# Patient Record
Sex: Female | Born: 1960 | Race: White | Hispanic: No | Smoking: Never smoker
Health system: Southern US, Community
[De-identification: ages and names within clinical notes are randomized; demographics above are authoritative.]

## PROBLEM LIST (undated history)

## (undated) DIAGNOSIS — M8580 Other specified disorders of bone density and structure, unspecified site: Secondary | ICD-10-CM

## (undated) HISTORY — PX: FRACTURE SURGERY: SHX138

---

## 2004-08-15 ENCOUNTER — Ambulatory Visit: Payer: Self-pay | Admitting: Obstetrics and Gynecology

## 2005-08-30 ENCOUNTER — Ambulatory Visit: Payer: Self-pay | Admitting: Obstetrics and Gynecology

## 2006-05-30 ENCOUNTER — Ambulatory Visit: Payer: Self-pay | Admitting: Internal Medicine

## 2006-11-12 ENCOUNTER — Ambulatory Visit: Payer: Self-pay | Admitting: Obstetrics and Gynecology

## 2007-12-01 ENCOUNTER — Ambulatory Visit: Payer: Self-pay | Admitting: Obstetrics and Gynecology

## 2008-12-02 ENCOUNTER — Ambulatory Visit: Payer: Self-pay | Admitting: Obstetrics and Gynecology

## 2010-02-02 ENCOUNTER — Ambulatory Visit: Payer: Self-pay | Admitting: Unknown Physician Specialty

## 2010-06-12 ENCOUNTER — Emergency Department: Payer: Self-pay | Admitting: Unknown Physician Specialty

## 2010-06-15 ENCOUNTER — Ambulatory Visit: Payer: Self-pay | Admitting: Orthopedic Surgery

## 2013-08-18 ENCOUNTER — Inpatient Hospital Stay: Payer: Self-pay | Admitting: Internal Medicine

## 2013-08-18 LAB — COMPREHENSIVE METABOLIC PANEL
AST: 322 U/L — AB (ref 15–37)
Albumin: 3.2 g/dL — ABNORMAL LOW (ref 3.4–5.0)
Alkaline Phosphatase: 102 U/L
Anion Gap: 9 (ref 7–16)
BUN: 8 mg/dL (ref 7–18)
Bilirubin,Total: 1 mg/dL (ref 0.2–1.0)
CHLORIDE: 95 mmol/L — AB (ref 98–107)
Calcium, Total: 8 mg/dL — ABNORMAL LOW (ref 8.5–10.1)
Co2: 27 mmol/L (ref 21–32)
Creatinine: 0.91 mg/dL (ref 0.60–1.30)
Glucose: 112 mg/dL — ABNORMAL HIGH (ref 65–99)
OSMOLALITY: 262 (ref 275–301)
POTASSIUM: 3.2 mmol/L — AB (ref 3.5–5.1)
SGPT (ALT): 453 U/L — ABNORMAL HIGH
SODIUM: 131 mmol/L — AB (ref 136–145)
Total Protein: 6.4 g/dL (ref 6.4–8.2)

## 2013-08-18 LAB — CBC WITH DIFFERENTIAL/PLATELET
Basophil #: 0 10*3/uL (ref 0.0–0.1)
Basophil %: 0.8 %
EOS PCT: 0 %
Eosinophil #: 0 10*3/uL (ref 0.0–0.7)
HCT: 33.8 % — AB (ref 35.0–47.0)
HGB: 11.9 g/dL — ABNORMAL LOW (ref 12.0–16.0)
LYMPHS ABS: 1.1 10*3/uL (ref 1.0–3.6)
Lymphocyte %: 24.7 %
MCH: 29.6 pg (ref 26.0–34.0)
MCHC: 35.2 g/dL (ref 32.0–36.0)
MCV: 84 fL (ref 80–100)
MONOS PCT: 6.1 %
Monocyte #: 0.3 x10 3/mm (ref 0.2–0.9)
NEUTROS ABS: 3.1 10*3/uL (ref 1.4–6.5)
Neutrophil %: 68.4 %
Platelet: 112 10*3/uL — ABNORMAL LOW (ref 150–440)
RBC: 4.02 10*6/uL (ref 3.80–5.20)
RDW: 13.6 % (ref 11.5–14.5)
WBC: 4.5 10*3/uL (ref 3.6–11.0)

## 2013-08-18 LAB — URINALYSIS, COMPLETE
BACTERIA: NONE SEEN
GLUCOSE, UR: NEGATIVE mg/dL (ref 0–75)
Leukocyte Esterase: NEGATIVE
Nitrite: NEGATIVE
Ph: 5 (ref 4.5–8.0)
Protein: 100
RBC,UR: 1 /HPF (ref 0–5)
Specific Gravity: 1.025 (ref 1.003–1.030)
WBC UR: 2 /HPF (ref 0–5)

## 2013-08-18 LAB — LIPASE, BLOOD: Lipase: 95 U/L (ref 73–393)

## 2013-08-18 LAB — TROPONIN I: Troponin-I: <0.02 ng/mL

## 2013-08-18 LAB — SEDIMENTATION RATE: ERYTHROCYTE SED RATE: 13 mm/h (ref 0–30)

## 2013-08-19 LAB — COMPREHENSIVE METABOLIC PANEL
AST: 167 U/L — AB (ref 15–37)
Albumin: 2.3 g/dL — ABNORMAL LOW (ref 3.4–5.0)
Alkaline Phosphatase: 95 U/L
Anion Gap: 7 (ref 7–16)
BUN: 9 mg/dL (ref 7–18)
Bilirubin,Total: 0.9 mg/dL (ref 0.2–1.0)
CALCIUM: 5.8 mg/dL — AB (ref 8.5–10.1)
CHLORIDE: 107 mmol/L (ref 98–107)
Co2: 23 mmol/L (ref 21–32)
Creatinine: 0.86 mg/dL (ref 0.60–1.30)
GLUCOSE: 91 mg/dL (ref 65–99)
OSMOLALITY: 272 (ref 275–301)
Potassium: 3.3 mmol/L — ABNORMAL LOW (ref 3.5–5.1)
SGPT (ALT): 299 U/L — ABNORMAL HIGH
Sodium: 137 mmol/L (ref 136–145)
Total Protein: 5.1 g/dL — ABNORMAL LOW (ref 6.4–8.2)

## 2013-08-19 LAB — CBC WITH DIFFERENTIAL/PLATELET
Basophil #: 0 10*3/uL (ref 0.0–0.1)
Basophil %: 0.6 %
Eosinophil #: 0 10*3/uL (ref 0.0–0.7)
Eosinophil %: 0.1 %
HCT: 29.8 % — AB (ref 35.0–47.0)
HGB: 9.9 g/dL — ABNORMAL LOW (ref 12.0–16.0)
Lymphocyte #: 2.7 10*3/uL (ref 1.0–3.6)
Lymphocyte %: 43.3 %
MCH: 28.7 pg (ref 26.0–34.0)
MCHC: 33.4 g/dL (ref 32.0–36.0)
MCV: 86 fL (ref 80–100)
MONOS PCT: 3.8 %
Monocyte #: 0.2 x10 3/mm (ref 0.2–0.9)
NEUTROS PCT: 52.2 %
Neutrophil #: 3.3 10*3/uL (ref 1.4–6.5)
PLATELETS: 112 10*3/uL — AB (ref 150–440)
RBC: 3.46 10*6/uL — ABNORMAL LOW (ref 3.80–5.20)
RDW: 14.1 % (ref 11.5–14.5)
WBC: 6.3 10*3/uL (ref 3.6–11.0)

## 2013-08-20 LAB — COMPREHENSIVE METABOLIC PANEL
ALT: 257 U/L — AB
AST: 117 U/L — AB (ref 15–37)
Albumin: 2.4 g/dL — ABNORMAL LOW (ref 3.4–5.0)
Alkaline Phosphatase: 87 U/L
Anion Gap: 12 (ref 7–16)
BILIRUBIN TOTAL: 0.6 mg/dL (ref 0.2–1.0)
BUN: 9 mg/dL (ref 7–18)
CALCIUM: 6.2 mg/dL — AB (ref 8.5–10.1)
CHLORIDE: 112 mmol/L — AB (ref 98–107)
CO2: 17 mmol/L — AB (ref 21–32)
CREATININE: 0.75 mg/dL (ref 0.60–1.30)
EGFR (African American): >60
GLUCOSE: 93 mg/dL (ref 65–99)
Osmolality: 280 (ref 275–301)
POTASSIUM: 3.9 mmol/L (ref 3.5–5.1)
Sodium: 141 mmol/L (ref 136–145)
Total Protein: 5 g/dL — ABNORMAL LOW (ref 6.4–8.2)

## 2013-08-20 LAB — CBC WITH DIFFERENTIAL/PLATELET
BASOS PCT: 1 %
Basophil #: 0.1 10*3/uL (ref 0.0–0.1)
EOS PCT: 0 %
Eosinophil #: 0 10*3/uL (ref 0.0–0.7)
HCT: 31.9 % — ABNORMAL LOW (ref 35.0–47.0)
HGB: 10.4 g/dL — ABNORMAL LOW (ref 12.0–16.0)
Lymphocyte #: 3.3 10*3/uL (ref 1.0–3.6)
Lymphocytes: 39 %
MCH: 28.6 pg (ref 26.0–34.0)
MCHC: 32.7 g/dL (ref 32.0–36.0)
MCV: 87 fL (ref 80–100)
Monocyte #: 0.3 x10 3/mm (ref 0.2–0.9)
Monocyte %: 5 %
Monocytes: 1 %
Neutrophil #: 2.7 10*3/uL (ref 1.4–6.5)
Platelet: 137 10*3/uL — ABNORMAL LOW (ref 150–440)
RBC: 3.65 10*6/uL — ABNORMAL LOW (ref 3.80–5.20)
RDW: 14.2 % (ref 11.5–14.5)
Segmented Neutrophils: 60 %
WBC: 6.4 10*3/uL (ref 3.6–11.0)

## 2013-08-20 LAB — URINE CULTURE

## 2013-08-20 LAB — CLOSTRIDIUM DIFFICILE(ARMC)

## 2013-08-21 LAB — BASIC METABOLIC PANEL
Anion Gap: 13 (ref 7–16)
BUN: 5 mg/dL — ABNORMAL LOW (ref 7–18)
CO2: 20 mmol/L — AB (ref 21–32)
Calcium, Total: 6.8 mg/dL — CL (ref 8.5–10.1)
Chloride: 109 mmol/L — ABNORMAL HIGH (ref 98–107)
Creatinine: 0.53 mg/dL — ABNORMAL LOW (ref 0.60–1.30)
EGFR (Non-African Amer.): >60
GLUCOSE: 83 mg/dL (ref 65–99)
OSMOLALITY: 280 (ref 275–301)
POTASSIUM: 3.3 mmol/L — AB (ref 3.5–5.1)
Sodium: 142 mmol/L (ref 136–145)

## 2013-08-21 LAB — MAGNESIUM: MAGNESIUM: 1.8 mg/dL

## 2013-08-22 LAB — BETA STREP CULTURE(ARMC)

## 2013-08-23 LAB — CULTURE, BLOOD (SINGLE)

## 2013-08-30 LAB — CULTURE, BLOOD (SINGLE)

## 2014-05-08 NOTE — H&P (Signed)
PATIENT NAME:  Paige Reyes, Paige Reyes MR#:  [id number] DATE OF BIRTH:  Aug 22, 1960  DATE OF ADMISSION:  08/18/2013  REFERRING PHYSICIAN:  Dr. Karma Greaser.  PRIMARY CARE PHYSICIAN: [place].   CHIEF COMPLAINT: Fever.   HISTORY OF PRESENT ILLNESS: A 54 year old Caucasian female without significant past medical history presented with fever. Describes 1 week duration of fever which she mentions as being fairly constant, originally occurring on last Tuesday exactly 7 days ago. She saw her PCP 4 days ago and was started on Bactrim for suspected urinary tract infection; however, she was asymptomatic at that time. Despite the addition of antibiotic therapy, had no change in symptomatology. She now complains of having 1 day duration of nausea and nonbloody emesis with a few bouts of emesis. Denies any further symptomatology otherwise other than general fatigue.   REVIEW OF SYSTEMS:  CONSTITUTIONAL: Positive for fevers, chills, fatigue, weakness.  EYES: Denies blurred vision, double vision or eye pain. EARS, NOSE AND THROAT: Denies tinnitus, ear pain or hearing loss.  RESPIRATORY: Denies cough, wheeze, shortness of breath.  CARDIOVASCULAR: Denies chest, palpitations or edema.  GASTROINTESTINAL: Positive for nausea, vomiting as described above.  Denies diarrhea or abdominal pain.  GENITOURINARY: Denies dysuria or hematuria. ENDOCRINE: Denies nocturia or thyroid problems.  HEMATOLOGY AND LYMPHOLOGY:  Denies easy bruising, bleeding. SKIN: Denies rash or lesions.  MUSCULOSKELETAL: Denies pain in neck, back, shoulders, knees, hips or arthritic symptoms.  NEUROLOGIC: Denies paralysis, paresthesias.  PSYCHIATRIC: Denies anxiety or depressive symptoms.  Otherwise, full review of systems performed by me is negative.   PAST MEDICAL HISTORY: None.   SOCIAL HISTORY: Denies alcohol, tobacco, or drug use. Denies any recent travel. Denies any sick contacts.   FAMILY HISTORY: Positive for mother with rheumatoid arthritis.    ALLERGIES: No known drug allergies.   HOME MEDICATIONS: None.   PHYSICAL EXAMINATION:  VITAL SIGNS: Temperature 105, heart rate 112, respirations 18, blood pressure 96/55, saturating 92% on room air.  GENERAL: Weak and ill-appearing Caucasian female, currently in no acute distress.  HEAD: Normocephalic, atraumatic.  EYES: Pupils equal, round, reactive to light. Extraocular muscles intact. No scleral icterus.  MOUTH: Moist mucosal membrane. Dentition intact. No abscess noted. EARS, NOSE AND THROAT:  Clear without exudates. No external lesions.  NECK: Supple. No thyromegaly. No nodules. No JVD.  PULMONARY: Clear to auscultation bilaterally without wheezes, rubs or rhonchi. No use of accessory muscles. Good respiratory effort.  CHEST: Nontender to palpation.  CARDIOVASCULAR: S1, S2, tachycardic. No murmurs, rubs, or gallops. No edema. Pedal pulses 2+ bilaterally.  GASTROINTESTINAL: Soft, nontender, nondistended. No masses. Positive bowel sounds. No hepatosplenomegaly.  MUSCULOSKELETAL: No swelling, clubbing, or edema. Range of motion full in all extremities.  NEUROLOGIC: Cranial nerves II through XII intact. No gross focal neurological deficits. Sensation intact. Reflexes intact.  SKIN: No ulceration, lesions, rash, cyanosis. Skin warm, dry. Turgor intact.  PSYCHIATRIC: Mood and affect blunted. She is awake, alert, oriented x 3. Insight and judgment intact.   LABORATORY DATA: Chest x-ray performed: Findings suggestive of airway disease and bronchitis and atypical infection also possible. Abdominal ultrasound performed revealing probable gallbladder polyp, otherwise negative. Remainder of laboratory data: Sodium 131, potassium 3.2, chloride 95, bicarbonate 27, BUN 8, creatinine 0.9, glucose 112. LFTs: Albumin 3.2, AST 322, ALT 453. WBC 4.5, hemoglobin 11.9, platelets of 112. Urinalysis negative for evidence of infection.   ASSESSMENT AND PLAN: A 54 year old Caucasian female without significant  history presenting with 1 week duration of fever despite antibiotic therapy.   1. Sepsis: Meeting  septic criteria by heart rate, temperature and culture, especially blood cultures. Antibiotics with vancomycin and  Zosyn. We will taper antibiotics and culture (Dictation Anomaly) <MISSING TEXT>   returns. Would potentially suspect gram-negative given height of fever versus viral. If all infectious etiology is negative would search for possible rheumatological etiologies given the family of rheumatoid arthritis. She has no further symptoms suggestive of  (Dictation Anomaly) <MISSING TEXT>    at this point. We will check an ESR as well.  2. Hyponatremia: Intravenous fluid hydration with normal saline. Follow sodium levels.  3. Hypokalemia. Replace potassium, goal of 4 to 5. 4. Venous thromboembolism prophylaxis with  heparin subcutaneous.  CODE STATUS: The patient is a full code.   TIME SPENT:  45 minutes.    ____________________________ Aaron Mose. Yaroslav Gombos, MD dkh:jh D: 08/18/2013 22:26:58 ET T: 08/18/2013 23:54:03 ET JOB#: [id number]  cc: Aaron Mose. Glynda Soliday, MD, <Dictator> Garrett Bowring Woodfin Ganja MD ELECTRONICALLY SIGNED 08/19/2013 3:12

## 2014-05-08 NOTE — Discharge Summary (Signed)
PATIENT NAME:  Paige Reyes, Paige Reyes MR#:  [id number] DATE OF BIRTH:  [birth year]  DATE OF ADMISSION:  08/18/2013 DATE OF DISCHARGE:  08/22/2013  DISCHARGE DIAGNOSES:  1.  Febrile illness.  2.  Sepsis.  3.  Transaminitis.  4.  Paroxysmal atrial fibrillation due to sepsis, returned to normal sinus rhythm.  5.  Pharyngitis.  6.  Hyperkalemia.  7.  Anemia.   DISCHARGE MEDICATIONS:  1. Tylenol 650 mg oral every 4 hours as needed for pain or fever.  2.  Ibuprofen 400 mg oral every 6 hours as needed for pain and fever.  3.  Doxycycline 100 mg 2 times a day for 10 days.  4.  Zofran 4 mg oral 4 times a day as needed for nausea or vomiting.  5.  Aspirin 81 mg daily.   DISCHARGE INSTRUCTIONS: Regular food, regular activity. Follow up with primary care physician, Dr. Sampson GoonFitzgerald within a week. No work restrictions.   IMAGING STUDIES DONE: Includes a chest x-ray PA and lateral which showed some mild bronchitis changes.   Ultrasound of the abdomen showed a small gallbladder polyp, otherwise, normal.   CONSULTS: Dr. Sampson GoonFitzgerald with infectious disease.   ADMITTING HISTORY AND PHYSICAL: Please see detailed H and P dictated by Dr. Clint GuyHower. In brief, a 54 year old Caucasian female patient who works as an Tourist information centre managerelementary school teacher presents to the hospital complaining of 1 week duration of fevers. The patient was started on Bactrim for a urinary tract infection, but with no improvement in fever, feeling extremely weak, nauseous, vomiting, presented to the hospital.   HOSPITAL COURSE:  1.  Acute febrile illness secondary to viral infection. The patient was initially started on vancomycin and Zosyn for broad-spectrum coverage after she was found to be in sepsis with temperature 105 in the Emergency Room. The patient slowly had lower temperatures with antibiotics. Dr. Sampson GoonFitzgerald with infectious diseases was consulted who suggested discontinuing vancomycin and Zosyn. She was started on Levaquin and doxycycline after  she gave further history about a possible tick bite.  Also with some mild transaminitis,  EBV, CMV, leptospiral Legionella were checked, which have all been negative. Hepatitis A, B, and C are  negative. Blood cultures are negative. She had some mild pharyngitis, which has resolved without any dysphagia. She also had some nausea and vomiting to start with, which has resolved.   With symptom resolution and no further fevers, on discussing with Dr. Sampson GoonFitzgerald, he has advised continuing the doxycycline to finish a total of 14-day course and follow up with him in a week.   2.  Paroxysmal atrial fibrillation. The patient had a brief episode of paroxysmal atrial fibrillation overnight prior to discharge with tachycardia secondary to fever. This was only one episode. No prior episodes of atrial fibrillation, and return to normal sinus rhythm with resolution of fever. At this point, she is normal sinus rhythm with normal rate and no rate control medications are needed, but I have started her on aspirin after discussing regarding thromboembolic risk.   Prior to discharge, the patient's abdomen is nontender. S1, S2 heard without any murmurs. Lungs sound clear. No signs of pharyngitis.   DISCHARGE TIME SPENT ON DAY OF DISCHARGE: 40 minutes.    ____________________________ Molinda BailiffSrikar R. Sophie Quiles, MD srs:ts D: 08/25/2013 13:00:56 ET T: 08/25/2013 13:19:58 ET JOB#: [id number]  cc: Wardell HeathSrikar R. Kiffany Schelling, MD, <Dictator> Alan MulderShamil Reyes. Morayati, MD Stann Mainlandavid P. Sampson GoonFitzgerald, MD Orie FishermanSRIKAR R Kawan [place] MD ELECTRONICALLY SIGNED 08/31/2013 14:11

## 2014-05-08 NOTE — Consult Note (Signed)
PATIENT NAME:  Paige Reyes, EGNEW MR#:  [id number] DATE OF BIRTH:  [birth year]  DATE OF CONSULTATION:  08/19/2013  REQUESTING PHYSICIAN:  Srikar R. Sudini, MD.  CONSULTING PHYSICIAN:  Cheral Marker. Ola Spurr, MD  REASON FOR CONSULTATION: Fever and hepatitis.   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 She reports being in her usual state of health approximately 8 days ago when she woke up feeling somewhat fatigued and tired. She then developed over the next day, high fevers and profound fatigue. She went to her primary care doctor on July 31,  and was started on Bactrim for presumed urinary tract infection. No blood was able to be obtained at that visit. She, however, continued to progress and feel quite ill with high fevers. She then developed nausea and vomiting. She has had very poor p.o. intake. She was admitted and has had fevers up to 105 since admission.   The patient denies any headaches or neck stiffness, swollen glands or weight loss prior to this. She does have some sore throat and dry throat now, but that has been more since admission with the vomiting and dehydration. She has not had any chest pain, shortness or breath or cough. She has had no diarrhea.  In fact, cannot recall her last bowel movement. She does not have much abdominal pain. She has had the nausea and dry heaves as above. She has had no dysuria, vaginal discharge or lesions. She has had no rash or joint swelling or pain. Notably, she has had no myalgias or arthralgias.   She has not had any travel in the last several months. She lives at home with her daughter, her son and her 18[place]monthold [place] The grandbaby did recently have a febrile illness with  bilateral ear infections approximately 3-4 weeks ago. The child is not in daycare. The patient has a pet dog and 4 cats, all of whom have been well.  She has no other  animal contact. Her friend, who she visited about a month ago, does have lizards, but no farm exposure. She has done no unusual activities. No fresh water contact recently. She has well water at home, but usually drinks bottled water. There have been no other sick members in her family.   PAST MEDICAL HISTORY: None.   PAST SURGICAL HISTORY: None.   SOCIAL HISTORY: As above. No recent travel. Does not smoke, drink or use drugs. She is an eChiropodist   FAMILY HISTORY: Positive for rheumatoid arthritis in her mother.   ALLERGIES: No known drug allergies.   ANTIBIOTICS SINCE ADMISSION: Vancomycin and Zosyn.   REVIEW OF SYSTEMS:  Eleven systems were reviewed and negative except as per HPI.   PHYSICAL EXAMINATION: VITAL SIGNS: Temperature the last 24 hours is 105. Pulse 108, blood pressure 99/59, respirations 20, saturation 96% on 2 liters.  GENERAL: She is ill appearing. She is febrile currently. HEENT: Her pupils are equal, round, and reactive to light and accommodation. Extraocular movements are intact. Sclerae are anicteric. She does have some conjunctival injection.  Oropharynx:  Her throat and tongue are quite dry. It is difficult to see if there is any exudate.  NECK: Supple. No anterior cervical, posterior cervical or supraclavicular lymphadenopathy. HEART:  Tachy, but regular.  LUNGS: Clear.  ABDOMEN: Soft, mildly distended, nontender. No hepatosplenomegaly.  EXTREMITIES: No clubbing, cyanosis or edema.  SKIN: No rashes. No peripheral stigmata of endocarditis.  MUSCULOSKELETAL: No joint pain or swelling.  LABORATORY, DIAGNOSTIC AND RADIOLOGICAL DATA: Ultrasound of her abdomen revealed probable small gallbladder polyp, but otherwise normal. Chest x-ray showed possible airway disease, bronchitis or atypical infection. There is a punctate calcified granuloma in the right upper lobe. Blood cultures x2 are no growth to date. White count on admission was 4.5, hemoglobin  11.9, platelets 112,000. Differential was normal. Repeat white blood count is 6.3. ESR is 13. Urine culture negative. Urinalysis had 2 white cells in it.  Renal function is normal with creatinine of 0.86. On admission, her sodium was low at 131.  LFTs on admission showed an AST of 3222 ALT of 453. These are now down to 167 and 299 respectively. Total bilirubin was 1.0, alkaline phosphatase 102, total protein 6.4. Troponins negative.   IMPRESSION: A 54 year old female previously quite healthy, admitted with a little more than a week of high fevers and fatigue without any other associated symptoms until she developed nausea and vomiting. She has elevated AST and ALT, consistent with hepatocellular injury, with a normal alkaline phosphatase and total bilirubin. Her abdominal ultrasound is normal. She does have thrombocytopenia and anemia. Her white blood count, however, is normal. She has a history of a tick bite approximately 1 month ago. She has contact with her sick grandchild as well. Other than that she has no other unusual exposures or contacts.   IMPRESSION:  I suspect this could be a viral such as cytomegalovirus, Epstein-Barr virus or parvovirus. Other etiologies could include a tickborne illnesses such as [facility] spotted fever ehrlichiosis or anaplasmosis.   RECOMMENDATIONS: 1.  Start doxycycline 100 mg IV q. 12.  2.  Hepatitis A, B, and C serologies as well as EBV super antigen panel is pending.  3.  I will check a parvovirus B19, PCR, CMV PCR, [facility] spotted fever and Ehrlichia serologies. We will also check Leptospira antibody.  4.  Continue supportive care. Her LFTs do seem to be trending down some.   Thank you very much for this interesting consult. I will be glad to follow with you.    ____________________________ Cheral Marker. Ola Spurr, MD dpf:ds D: 08/19/2013 16:04:12 ET T: 08/19/2013 16:30:46 ET JOB#: [id number]  cc: Cheral Marker. Ola Spurr, MD, <Dictator> Thaine Garriga Ola Spurr  MD ELECTRONICALLY SIGNED 08/21/2013 22:08

## 2014-09-02 ENCOUNTER — Ambulatory Visit
Admission: EM | Admit: 2014-09-02 | Discharge: 2014-09-02 | Disposition: A | Discharge status: Home / Self Care | Payer: BC Managed Care – PPO | Attending: Family Medicine | Admitting: Family Medicine

## 2014-09-02 ENCOUNTER — Encounter: Payer: Self-pay | Admitting: Emergency Medicine

## 2014-09-02 DIAGNOSIS — J40 Bronchitis, not specified as acute or chronic: Secondary | ICD-10-CM | POA: Diagnosis not present

## 2014-09-02 DIAGNOSIS — J040 Acute laryngitis: Secondary | ICD-10-CM

## 2014-09-02 HISTORY — DX: Other specified disorders of bone density and structure, unspecified site: M85.80

## 2014-09-02 MED ORDER — HYDROCOD POLST-CPM POLST ER 10-8 MG/5ML PO SUER: 5.0000 mL | Freq: Two times a day (BID) | ORAL | Status: DC | PRN

## 2014-09-02 MED ORDER — AZITHROMYCIN 250 MG PO TABS: ORAL_TABLET | ORAL | Status: DC

## 2014-09-02 NOTE — ED Notes (Signed)
Patient states "I've had a cough for a week and a half and it seems to be getting worse"

## 2014-09-02 NOTE — Discharge Instructions (Signed)
Laryngitis °Laryngitis is redness, soreness, and puffiness (inflammation) of the vocal cords. It causes hoarseness, cough, loss of voice, sore throat, and dry throat. It may be caused by: °· Infection. °· Too much smoking. °· Too much talking or yelling. °· Breathing in of toxic fumes. °· Allergies. °· A backup of acid from your stomach.  °HOME CARE °· Drink enough fluids to keep your pee (urine) clear or pale yellow. °· Rest until you no longer have problems or as told by your doctor. °· Breathe in moist air. °· Take all medicine as told by your doctor. °· Do not smoke. °· Talk as little as possible (this includes whispering). °· Write on paper instead of talking until your voice is back to normal. °· Follow up with your doctor if you have not improved after 10 days. °GET HELP IF:  °· You have trouble breathing. °· You cough up blood. °· You have a fever that will not go away. °· You have increasing pain. °· You have trouble swallowing. °MAKE SURE YOU: °· Understand these instructions. °· Will watch your condition. °· Will get help right away if you are not doing well or get worse. °Document Released: 12/21/2010 Document Revised: 03/26/2011 Document Reviewed: 12/21/2010 °ExitCare® Patient Information ©2015 [employer]. This information is not intended to replace advice given to you by your health care provider. Make sure you discuss any questions you have with your health care provider. ° °Upper Respiratory Infection, Adult °An upper respiratory infection (URI) is also known as the common cold. It is often caused by a type of germ (virus). Colds are easily spread (contagious). You can pass it to others by kissing, coughing, sneezing, or drinking out of the same glass. Usually, you get better in 1 or 2 weeks.  °HOME CARE  °· Only take medicine as told by your doctor. °· Use a warm mist humidifier or breathe in steam from a hot shower. °· Drink enough water and fluids to keep your pee (urine) clear or pale  yellow. °· Get plenty of rest. °· Return to work when your temperature is back to normal or as told by your doctor. You may use a face mask and wash your hands to stop your cold from spreading. °GET HELP RIGHT AWAY IF:  °· After the first few days, you feel you are getting worse. °· You have questions about your medicine. °· You have chills, shortness of breath, or brown or red spit (mucus). °· You have yellow or brown snot (nasal discharge) or pain in the face, especially when you bend forward. °· You have a fever, puffy (swollen) neck, pain when you swallow, or white spots in the back of your throat. °· You have a bad headache, ear pain, sinus pain, or chest pain. °· You have a high-pitched whistling sound when you breathe in and out (wheezing). °· You have a lasting cough or cough up blood. °· You have sore muscles or a stiff neck. °MAKE SURE YOU:  °· Understand these instructions. °· Will watch your condition. °· Will get help right away if you are not doing well or get worse. °Document Released: 06/20/2007 Document Revised: 03/26/2011 Document Reviewed: 04/08/2013 °ExitCare® Patient Information ©2015 [employer]. This information is not intended to replace advice given to you by your health care provider. Make sure you discuss any questions you have with your health care provider. ° °

## 2014-09-02 NOTE — ED Provider Notes (Signed)
CSN: [id number]     Arrival date & time 09/02/14  4098 History   First MD Initiated Contact with Patient 09/02/14 0915     Chief Complaint  Patient presents with  . Cough   (Consider location/radiation/quality/duration/timing/severity/associated sxs/prior Treatment) Patient is a 54 y.o. female presenting with cough. The history is provided by the patient. No language interpreter was used.  Cough Cough characteristics:  Non-productive Severity:  Moderate Onset quality:  Gradual Duration:  10 days Progression:  Worsening Chronicity:  New Context: sick contacts and upper respiratory infection   Context: not smoke exposure   Relieved by:  Nothing Worsened by:  Nothing tried Associated symptoms: sinus congestion   Associated symptoms: no chest pain, no chills, no ear fullness, no ear pain, no fever, no rhinorrhea, no shortness of breath, no sore throat and no wheezing   Associated symptoms comment:  Reports having some hoarseness right before this started in late July or early August   History osteopenia. She's had a fracture. Left wrist. She did has never smoked and does not live smoker either.  Past Medical History  Diagnosis Date  . Osteopenia    Past Surgical History  Procedure Laterality Date  . Fracture surgery     History reviewed. No pertinent family history. Social History  Substance Use Topics  . Smoking status: Never Smoker   . Smokeless tobacco: None  . Alcohol Use: No   OB History    No data available     Review of Systems  Constitutional: Negative for fever and chills.  HENT: Negative for ear pain, rhinorrhea and sore throat.   Respiratory: Positive for cough. Negative for shortness of breath and wheezing.   Cardiovascular: Negative for chest pain.    Allergies  Review of patient's allergies indicates no known allergies.  Home Medications   Prior to Admission medications   Medication Sig Start Date End Date Taking? Authorizing Provider  aspirin 81 MG  chewable tablet Chew by mouth daily.   Yes Historical Provider, MD  calcium carbonate (OS-CAL) 1250 (500 CA) MG chewable tablet Chew 1 tablet by mouth daily.   Yes Historical Provider, MD  Vitamin D, Ergocalciferol, (DRISDOL) 50000 UNITS CAPS capsule Take 50,000 Units by mouth every 7 (seven) days.   Yes Historical Provider, MD  azithromycin (ZITHROMAX Z-PAK) 250 MG tablet Take 2 tablets first day and then 1 po a day for 4 days 09/02/14   Hassan Rowan, MD  chlorpheniramine-HYDROcodone [facility] ER) 10-8 MG/5ML SUER Take 5 mLs by mouth every 12 (twelve) hours as needed for cough. 09/02/14   Hassan Rowan, MD   BP 110/61 mmHg  Pulse 71  Temp(Src) 99.8 F (37.7 C) (Tympanic)  Resp 18  Ht 5\' 6"  (1.676 m)  Wt 125 lb (56.7 kg)  BMI 20.19 kg/m2  SpO2 100% Physical Exam  Constitutional: She is oriented to person, place, and time. She appears well-developed and well-nourished. She is not intubated.  HENT:  Head: Normocephalic and atraumatic.  Right Ear: Hearing and tympanic membrane normal.  Left Ear: Hearing and tympanic membrane normal.  Nose: Mucosal edema present. No rhinorrhea.  Mouth/Throat: Uvula is midline, oropharynx is clear and moist and mucous membranes are normal. Normal dentition.  Eyes: Pupils are equal, round, and reactive to light.  Neck: Normal range of motion.  Cardiovascular: Normal rate, regular rhythm and normal heart sounds.   Pulmonary/Chest: Effort normal. No accessory muscle usage. She is not intubated. No respiratory distress.  Lungs are clear to auscultation  Musculoskeletal: Normal range of motion.  Neurological: She is alert and oriented to person, place, and time. No cranial nerve deficit.  Skin: Skin is warm and dry. No erythema.  Psychiatric: She has a normal mood and affect.    ED Course  Procedures (including critical care time) Labs Review Labs Reviewed - No data to display  Imaging Review No results found.   MDM   1. Laryngitis   2.  Bronchitis with tracheitis     Due to the duration of this being 10 days with previous episode of some hoarseness and laryngitis will treat with Z-Pak Tussionex. If not better in 7-10 days patient instructed to return for possible chest x-ray at that time. Work note offered the patient's off work until Monday.  Hassan Rowan, MD 09/02/14 [phone]

## 2015-01-03 ENCOUNTER — Ambulatory Visit: Payer: BC Managed Care – PPO | Admitting: Anesthesiology

## 2015-01-03 ENCOUNTER — Ambulatory Visit
Admission: RE | Admit: 2015-01-03 | Discharge: 2015-01-03 | Disposition: A | Discharge status: Home / Self Care | Payer: BC Managed Care – PPO | Source: Ambulatory Visit | Attending: Unknown Physician Specialty | Admitting: Unknown Physician Specialty

## 2015-01-03 ENCOUNTER — Encounter
Admission: RE | Disposition: A | Discharge status: Home / Self Care | Payer: Self-pay | Source: Ambulatory Visit | Attending: Unknown Physician Specialty

## 2015-01-03 ENCOUNTER — Encounter: Payer: Self-pay | Admitting: Anesthesiology

## 2015-01-03 DIAGNOSIS — K640 First degree hemorrhoids: Secondary | ICD-10-CM | POA: Diagnosis not present

## 2015-01-03 DIAGNOSIS — Z79899 Other long term (current) drug therapy: Secondary | ICD-10-CM | POA: Diagnosis not present

## 2015-01-03 DIAGNOSIS — M8580 Other specified disorders of bone density and structure, unspecified site: Secondary | ICD-10-CM | POA: Diagnosis not present

## 2015-01-03 DIAGNOSIS — Z1211 Encounter for screening for malignant neoplasm of colon: Secondary | ICD-10-CM | POA: Insufficient documentation

## 2015-01-03 DIAGNOSIS — Z7982 Long term (current) use of aspirin: Secondary | ICD-10-CM | POA: Insufficient documentation

## 2015-01-03 HISTORY — PX: COLONOSCOPY WITH PROPOFOL: SHX5780

## 2015-01-03 SURGERY — COLONOSCOPY WITH PROPOFOL
Anesthesia: General

## 2015-01-03 MED ORDER — PHENYLEPHRINE HCL 10 MG/ML IJ SOLN
INTRAMUSCULAR | Status: DC | PRN
  Administered 2015-01-03: 50 ug via INTRAVENOUS

## 2015-01-03 MED ORDER — LIDOCAINE HCL (PF) 1 % IJ SOLN
INTRAMUSCULAR | Status: AC
  Administered 2015-01-03: 0.02 mL via INTRADERMAL
  Filled 2015-01-03: qty 2

## 2015-01-03 MED ORDER — LIDOCAINE HCL (PF) 1 % IJ SOLN
2.0000 mL | Freq: Once | INTRAMUSCULAR | Status: AC
  Administered 2015-01-03: 0.02 mL via INTRADERMAL

## 2015-01-03 MED ORDER — MIDAZOLAM HCL 2 MG/2ML IJ SOLN
INTRAMUSCULAR | Status: DC | PRN
  Administered 2015-01-03: 1 mg via INTRAVENOUS

## 2015-01-03 MED ORDER — FENTANYL CITRATE (PF) 100 MCG/2ML IJ SOLN
INTRAMUSCULAR | Status: DC | PRN
  Administered 2015-01-03: 50 ug via INTRAVENOUS

## 2015-01-03 MED ORDER — PROPOFOL 10 MG/ML IV BOLUS
INTRAVENOUS | Status: DC | PRN
  Administered 2015-01-03: 20 mg via INTRAVENOUS
  Administered 2015-01-03: 30 mg via INTRAVENOUS

## 2015-01-03 MED ORDER — SODIUM CHLORIDE 0.9 % IV SOLN
INTRAVENOUS | Status: DC
Start: 2015-01-03 — End: 2015-01-03
  Administered 2015-01-03: 1000 mL via INTRAVENOUS
  Administered 2015-01-03: 15:00:00 via INTRAVENOUS

## 2015-01-03 MED ORDER — SODIUM CHLORIDE 0.9 % IV SOLN
INTRAVENOUS | Status: DC
Start: 2015-01-03 — End: 2015-01-03

## 2015-01-03 MED ORDER — PROPOFOL 500 MG/50ML IV EMUL
INTRAVENOUS | Status: DC | PRN
  Administered 2015-01-03: 80 ug/kg/min via INTRAVENOUS

## 2015-01-03 NOTE — H&P (Signed)
   Primary Care Physician:  Rayetta HumphreyGeorge, Sionne A, MD Primary Gastroenterologist:  Dr. Mechele CollinElliott  Pre-Procedure History & Physical: HPI:  Paige Reyes is a 54 y.o. female is here for an colonoscopy.   Past Medical History  Diagnosis Date  . Osteopenia     Past Surgical History  Procedure Laterality Date  . Fracture surgery      Prior to Admission medications   Medication Sig Start Date End Date Taking? Authorizing Provider  aspirin 81 MG chewable tablet Chew by mouth daily.    Historical Provider, MD  azithromycin (ZITHROMAX Z-PAK) 250 MG tablet Take 2 tablets first day and then 1 po a day for 4 days 09/02/14   Hassan RowanEugene Wade, MD  calcium carbonate (OS-CAL) 1250 (500 CA) MG chewable tablet Chew 1 tablet by mouth daily.    Historical Provider, MD  chlorpheniramine-HYDROcodone (TUSSIONEX PENNKINETIC ER) 10-8 MG/5ML SUER Take 5 mLs by mouth every 12 (twelve) hours as needed for cough. 09/02/14   Hassan RowanEugene Wade, MD  Vitamin D, Ergocalciferol, (DRISDOL) 50000 UNITS CAPS capsule Take 50,000 Units by mouth every 7 (seven) days.    Historical Provider, MD    Allergies as of 12/15/2014  . (No Known Allergies)    History reviewed. No pertinent family history.  Social History   Social History  . Marital Status: Married    Spouse Name: N/A  . Number of Children: N/A  . Years of Education: N/A   Occupational History  . Not on file.   Social History Main Topics  . Smoking status: Never Smoker   . Smokeless tobacco: Not on file  . Alcohol Use: No  . Drug Use: Not on file  . Sexual Activity: Not on file   Other Topics Concern  . Not on file   Social History Narrative    Review of Systems: See HPI, otherwise negative ROS  Physical Exam: BP 97/55 mmHg  Temp(Src) 98.8 F (37.1 C) (Tympanic)  Resp 17  Ht 5\' 7"  (1.702 m)  Wt 56.7 kg (125 lb)  BMI 19.57 kg/m2  SpO2 98% General:   Alert,  pleasant and cooperative in NAD Head:  Normocephalic and atraumatic. Neck:  Supple; no masses  or thyromegaly. Lungs:  Clear throughout to auscultation.    Heart:  Regular rate and rhythm. Abdomen:  Soft, nontender and nondistended. Normal bowel sounds, without guarding, and without rebound.   Neurologic:  Alert and  oriented x4;  grossly normal neurologically.  Impression/Plan: Paige Reyes is here for an colonoscopy to be performed for colon cancer screening  Risks, benefits, limitations, and alternatives regarding  colonoscopy have been reviewed with the patient.  Questions have been answered.  All parties agreeable.   Lynnae PrudeELLIOTT, ROBERT, MD  01/03/2015, 3:09 PM

## 2015-01-03 NOTE — Anesthesia Preprocedure Evaluation (Signed)
Anesthesia Evaluation  Patient identified by MRN, date of birth, ID band Patient awake    Reviewed: Allergy & Precautions, H&P , NPO status , Patient's Chart, lab work & pertinent test results  Airway Mallampati: III  TM Distance: >3 FB Neck ROM: full    Dental no notable dental hx. (+) Teeth Intact   Pulmonary neg pulmonary ROS, neg shortness of breath,    Pulmonary exam normal breath sounds clear to auscultation       Cardiovascular Exercise Tolerance: Good (-) angina(-) Past MI and (-) DOE negative cardio ROS Normal cardiovascular exam Rhythm:regular Rate:Normal     Neuro/Psych negative neurological ROS  negative psych ROS   GI/Hepatic negative GI ROS, Neg liver ROS, neg GERD  ,  Endo/Other  negative endocrine ROS  Renal/GU negative Renal ROS  negative genitourinary   Musculoskeletal   Abdominal   Peds  Hematology negative hematology ROS (+)   Anesthesia Other Findings Past Medical History:   Osteopenia                                                  Past Surgical History:   FRACTURE SURGERY                                             BMI    Body Mass Index   19.57 kg/m 2      Reproductive/Obstetrics negative OB ROS                             Anesthesia Physical Anesthesia Plan  ASA: II  Anesthesia Plan: General   Post-op Pain Management:    Induction:   Airway Management Planned:   Additional Equipment:   Intra-op Plan:   Post-operative Plan:   Informed Consent: I have reviewed the patients History and Physical, chart, labs and discussed the procedure including the risks, benefits and alternatives for the proposed anesthesia with the patient or authorized representative who has indicated his/her understanding and acceptance.   Dental Advisory Given  Plan Discussed with: Anesthesiologist, CRNA and Surgeon  Anesthesia Plan Comments:         Anesthesia  Quick Evaluation

## 2015-01-03 NOTE — Op Note (Signed)
[facility] Gastroenterology Patient Name: Paige Reyes Procedure Date: 01/03/2015 3:09 PM MRN: [id number] Account #: [id number] Date of Birth: 1960-12-11 Admit Type: Outpatient Age: 89 [facility] ROOM 1 Gender: Female Note Status: Finalized Procedure:         Colonoscopy Indications:       Screening for colorectal malignant neoplasm Providers:         Scot Junobert T. Elliott, MD Referring MD:      Angus PalmsGeorge Sionne (Referring MD) Medicines:         Propofol per Anesthesia Complications:     No immediate complications. Procedure:         Pre-Anesthesia Assessment:                    - After reviewing the risks and benefits, the patient was                     deemed in satisfactory condition to undergo the procedure.                    After obtaining informed consent, the colonoscope was                     passed under direct vision. Throughout the procedure, the                     patient's blood pressure, pulse, and oxygen saturations                     were monitored continuously. The Colonoscope was                     introduced through the anus and advanced to the the cecum,                     identified by appendiceal orifice and ileocecal valve. The                     colonoscopy was somewhat difficult due to significant                     looping. Successful completion of the procedure was aided                     by applying abdominal pressure. The patient tolerated the                     procedure well. The quality of the bowel preparation was                     excellent. Findings:      The colon was long and looping requiring abd pressure.      Internal hemorrhoids were found during endoscopy. The hemorrhoids were       small and Grade I (internal hemorrhoids that do not prolapse).      The exam was otherwise without abnormality. Impression:        - Internal hemorrhoids.                    - The examination was otherwise normal.          - No specimens collected. Recommendation:    - Repeat colonoscopy in 10 years for screening purposes. Scot Junobert T Elliott, MD 01/03/2015 3:54:26 PM This report has been signed  electronically. Number of Addenda: 0 Note Initiated On: 01/03/2015 3:09 PM Scope Withdrawal Time: 0 hours 8 minutes 49 seconds  Total Procedure Duration: 0 hours 32 minutes 10 seconds       [facility]

## 2015-01-03 NOTE — Anesthesia Postprocedure Evaluation (Signed)
Anesthesia Post Note  Patient: Paige Reyes  Procedure(s) Performed: Procedure(s) (LRB): COLONOSCOPY WITH PROPOFOL (N/A)  Patient location during evaluation: Endoscopy Anesthesia Type: General Level of consciousness: awake and alert Pain management: pain level controlled Vital Signs Assessment: post-procedure vital signs reviewed and stable Respiratory status: spontaneous breathing, nonlabored ventilation, respiratory function stable and patient connected to nasal cannula oxygen Cardiovascular status: blood pressure returned to baseline and stable Postop Assessment: no signs of nausea or vomiting Anesthetic complications: no    Last Vitals:  Filed Vitals:   01/03/15 1618 01/03/15 1628  BP: 98/50 92/77  Pulse: 62 66  Temp:    Resp: 12 15    Last Pain: There were no vitals filed for this visit.               Cleda MccreedyJoseph K Tahsin Benyo

## 2015-01-03 NOTE — Transfer of Care (Signed)
Immediate Anesthesia Transfer of Care Note  Patient: Paige Reyes  Procedure(s) Performed: Procedure(s): COLONOSCOPY WITH PROPOFOL (N/A)  Patient Location: PACU  Anesthesia Type:General  Level of Consciousness: awake, alert  and oriented  Airway & Oxygen Therapy: Patient Spontanous Breathing and Patient connected to nasal cannula oxygen  Post-op Assessment: Report given to RN and Post -op Vital signs reviewed and stable  Post vital signs: Reviewed and stable  Last Vitals:  Filed Vitals:   01/03/15 1417  BP: 97/55  Temp: 37.1 C  Resp: 17    Complications: No apparent anesthesia complications

## 2015-01-06 ENCOUNTER — Encounter: Payer: Self-pay | Admitting: Unknown Physician Specialty

## 2015-01-19 ENCOUNTER — Other Ambulatory Visit: Payer: Self-pay | Admitting: Family Medicine

## 2015-01-19 DIAGNOSIS — Z1231 Encounter for screening mammogram for malignant neoplasm of breast: Secondary | ICD-10-CM

## 2015-01-25 ENCOUNTER — Ambulatory Visit: Payer: BC Managed Care – PPO

## 2015-02-01 ENCOUNTER — Ambulatory Visit: Payer: BC Managed Care – PPO

## 2015-02-02 ENCOUNTER — Ambulatory Visit
Admission: RE | Admit: 2015-02-02 | Discharge: 2015-02-02 | Disposition: A | Discharge status: Home / Self Care | Payer: BC Managed Care – PPO | Source: Ambulatory Visit | Attending: Family Medicine | Admitting: Family Medicine

## 2015-02-02 DIAGNOSIS — Z1231 Encounter for screening mammogram for malignant neoplasm of breast: Secondary | ICD-10-CM

## 2016-03-22 ENCOUNTER — Other Ambulatory Visit: Payer: Self-pay | Admitting: Family Medicine

## 2016-03-22 DIAGNOSIS — Z1231 Encounter for screening mammogram for malignant neoplasm of breast: Secondary | ICD-10-CM

## 2016-04-16 ENCOUNTER — Ambulatory Visit
Admission: RE | Admit: 2016-04-16 | Discharge: 2016-04-16 | Disposition: A | Discharge status: Home / Self Care | Payer: BC Managed Care – PPO | Source: Ambulatory Visit | Attending: Family Medicine | Admitting: Family Medicine

## 2016-04-16 DIAGNOSIS — Z1231 Encounter for screening mammogram for malignant neoplasm of breast: Secondary | ICD-10-CM

## 2017-02-27 ENCOUNTER — Other Ambulatory Visit: Payer: Self-pay | Admitting: Family Medicine

## 2017-02-27 DIAGNOSIS — R2242 Localized swelling, mass and lump, left lower limb: Secondary | ICD-10-CM

## 2017-03-04 ENCOUNTER — Other Ambulatory Visit: Payer: Self-pay | Admitting: Family Medicine

## 2017-03-04 ENCOUNTER — Ambulatory Visit
Admission: RE | Admit: 2017-03-04 | Discharge: 2017-03-04 | Disposition: A | Discharge status: Home / Self Care | Payer: BC Managed Care – PPO | Source: Ambulatory Visit | Attending: Family Medicine | Admitting: Family Medicine

## 2017-03-04 DIAGNOSIS — M25862 Other specified joint disorders, left knee: Secondary | ICD-10-CM | POA: Diagnosis present

## 2017-03-04 DIAGNOSIS — R2242 Localized swelling, mass and lump, left lower limb: Secondary | ICD-10-CM

## 2017-03-13 ENCOUNTER — Other Ambulatory Visit: Payer: Self-pay | Admitting: Family Medicine

## 2017-03-13 DIAGNOSIS — Z1231 Encounter for screening mammogram for malignant neoplasm of breast: Secondary | ICD-10-CM

## 2017-03-14 ENCOUNTER — Other Ambulatory Visit: Payer: Self-pay | Admitting: Internal Medicine

## 2017-03-14 DIAGNOSIS — M810 Age-related osteoporosis without current pathological fracture: Secondary | ICD-10-CM

## 2017-05-06 ENCOUNTER — Ambulatory Visit
Admission: RE | Admit: 2017-05-06 | Discharge: 2017-05-06 | Disposition: A | Discharge status: Home / Self Care | Payer: BC Managed Care – PPO | Source: Ambulatory Visit | Attending: Internal Medicine | Admitting: Internal Medicine

## 2017-05-06 ENCOUNTER — Ambulatory Visit
Admission: RE | Admit: 2017-05-06 | Discharge: 2017-05-06 | Disposition: A | Discharge status: Home / Self Care | Payer: BC Managed Care – PPO | Source: Ambulatory Visit | Attending: Family Medicine | Admitting: Family Medicine

## 2017-05-06 ENCOUNTER — Encounter (INDEPENDENT_AMBULATORY_CARE_PROVIDER_SITE_OTHER): Payer: Self-pay

## 2017-05-06 DIAGNOSIS — M810 Age-related osteoporosis without current pathological fracture: Secondary | ICD-10-CM | POA: Diagnosis present

## 2017-05-06 DIAGNOSIS — M818 Other osteoporosis without current pathological fracture: Secondary | ICD-10-CM | POA: Diagnosis not present

## 2017-05-06 DIAGNOSIS — Z1231 Encounter for screening mammogram for malignant neoplasm of breast: Secondary | ICD-10-CM | POA: Diagnosis present

## 2019-04-06 ENCOUNTER — Other Ambulatory Visit: Payer: Self-pay | Admitting: Internal Medicine

## 2019-04-06 DIAGNOSIS — M810 Age-related osteoporosis without current pathological fracture: Secondary | ICD-10-CM

## 2019-04-07 ENCOUNTER — Other Ambulatory Visit: Payer: Self-pay | Admitting: Internal Medicine

## 2019-04-07 DIAGNOSIS — Z1231 Encounter for screening mammogram for malignant neoplasm of breast: Secondary | ICD-10-CM

## 2019-05-12 ENCOUNTER — Other Ambulatory Visit: Payer: Self-pay

## 2019-05-12 ENCOUNTER — Ambulatory Visit
Admission: RE | Admit: 2019-05-12 | Discharge: 2019-05-12 | Disposition: A | Discharge status: Home / Self Care | Payer: BC Managed Care – PPO | Source: Ambulatory Visit | Attending: Internal Medicine | Admitting: Internal Medicine

## 2019-05-12 DIAGNOSIS — Z1231 Encounter for screening mammogram for malignant neoplasm of breast: Secondary | ICD-10-CM

## 2019-05-12 DIAGNOSIS — M810 Age-related osteoporosis without current pathological fracture: Secondary | ICD-10-CM

## 2020-03-04 ENCOUNTER — Other Ambulatory Visit: Payer: Self-pay | Admitting: Family Medicine

## 2020-03-04 DIAGNOSIS — Z1231 Encounter for screening mammogram for malignant neoplasm of breast: Secondary | ICD-10-CM

## 2020-05-12 ENCOUNTER — Ambulatory Visit
Admission: RE | Admit: 2020-05-12 | Discharge: 2020-05-12 | Disposition: A | Discharge status: Home / Self Care | Payer: BC Managed Care – PPO | Source: Ambulatory Visit | Attending: Family Medicine | Admitting: Family Medicine

## 2020-05-12 ENCOUNTER — Ambulatory Visit: Payer: BC Managed Care – PPO

## 2020-05-12 ENCOUNTER — Other Ambulatory Visit: Payer: Self-pay

## 2020-05-12 DIAGNOSIS — Z1231 Encounter for screening mammogram for malignant neoplasm of breast: Secondary | ICD-10-CM | POA: Diagnosis present

## 2021-03-14 ENCOUNTER — Other Ambulatory Visit: Payer: Self-pay | Admitting: Internal Medicine

## 2021-03-14 DIAGNOSIS — M810 Age-related osteoporosis without current pathological fracture: Secondary | ICD-10-CM

## 2021-03-17 ENCOUNTER — Other Ambulatory Visit: Payer: Self-pay | Admitting: Family Medicine

## 2021-03-17 DIAGNOSIS — Z1231 Encounter for screening mammogram for malignant neoplasm of breast: Secondary | ICD-10-CM

## 2021-05-16 ENCOUNTER — Ambulatory Visit
Admission: RE | Admit: 2021-05-16 | Discharge: 2021-05-16 | Disposition: A | Discharge status: Home / Self Care | Payer: BC Managed Care – PPO | Source: Ambulatory Visit | Attending: Internal Medicine | Admitting: Internal Medicine

## 2021-05-16 ENCOUNTER — Ambulatory Visit
Admission: RE | Admit: 2021-05-16 | Discharge: 2021-05-16 | Disposition: A | Discharge status: Home / Self Care | Payer: BC Managed Care – PPO | Source: Ambulatory Visit | Attending: Family Medicine | Admitting: Family Medicine

## 2021-05-16 DIAGNOSIS — Z1231 Encounter for screening mammogram for malignant neoplasm of breast: Secondary | ICD-10-CM | POA: Diagnosis not present

## 2021-05-16 DIAGNOSIS — M810 Age-related osteoporosis without current pathological fracture: Secondary | ICD-10-CM | POA: Diagnosis present

## 2021-09-17 ENCOUNTER — Other Ambulatory Visit: Payer: Self-pay

## 2021-09-17 ENCOUNTER — Ambulatory Visit
Admission: EM | Admit: 2021-09-17 | Discharge: 2021-09-17 | Disposition: A | Discharge status: Home / Self Care | Payer: BC Managed Care – PPO

## 2021-09-17 ENCOUNTER — Encounter: Payer: Self-pay | Admitting: Emergency Medicine

## 2021-09-17 DIAGNOSIS — M436 Torticollis: Secondary | ICD-10-CM | POA: Diagnosis not present

## 2021-09-17 MED ORDER — METHYLPREDNISOLONE 4 MG PO TBPK: ORAL_TABLET | ORAL | 0 refills | Status: AC

## 2021-09-17 MED ORDER — BACLOFEN 10 MG PO TABS: 10.0000 mg | ORAL_TABLET | Freq: Three times a day (TID) | ORAL | 0 refills | Status: AC

## 2021-09-17 MED ORDER — DIAZEPAM 5 MG PO TABS: 5.0000 mg | ORAL_TABLET | Freq: Every evening | ORAL | 0 refills | Status: AC

## 2021-09-17 NOTE — ED Triage Notes (Signed)
Pt c/o neck pain on her left side. Started this morning about 4 am. She states she is taking aleve with minimal relief. She states it hurts to turn her neck to the left but can turn it to the right. No known injury. She states she also gets "waves" of pain through her neck.

## 2021-09-17 NOTE — ED Provider Notes (Signed)
[facility]    CSN: [id number] Arrival date & time: 09/17/21  9833      History   Chief Complaint Chief Complaint  Patient presents with   Neck Pain    HPI Paige Reyes is a 61 y.o. female.   HPI  61 year old female here for evaluation of left-sided neck pain.  Patient reports that she woke up about 430 this morning with pain in her left neck.  She took some Tylenol and used a rice sock to help with the discomfort.  She was able to get back to sleep but the pain did not resolve.  Later on she took an Aleve and that has not resolved her symptoms as well.  She denies any numbness or tingling in her left arm and she denies any injury.  She did do a lot of yard work yesterday.  She reports that the pain is coming in waves.  She does not have a history of neck injury.  Past Medical History:  Diagnosis Date   Osteopenia     There are no problems to display for this patient.   Past Surgical History:  Procedure Laterality Date   COLONOSCOPY WITH PROPOFOL N/A 01/03/2015   Procedure: COLONOSCOPY WITH PROPOFOL;  Surgeon: Scot Jun, MD;  Location: [facility] ENDOSCOPY;  Service: Endoscopy;  Laterality: N/A;   FRACTURE SURGERY      OB History   No obstetric history on file.      Home Medications    Prior to Admission medications   Medication Sig Start Date End Date Taking? Authorizing Provider  baclofen (LIORESAL) 10 MG tablet Take 1 tablet (10 mg total) by mouth 3 (three) times daily. 09/17/21  Yes Becky Augusta, NP  buPROPion (WELLBUTRIN XL) 150 MG 24 hr tablet Take by mouth. 05/25/21 05/26/22 Yes [provider]  calcium carbonate (OS-CAL) 1250 (500 CA) MG chewable tablet Chew 1 tablet by mouth daily.   Yes [provider]  Coenzyme Q10 100 MG capsule  02/03/19  Yes [provider]  diazepam (VALIUM) 5 MG tablet Take 1 tablet (5 mg total) by mouth at bedtime for 5 days. 09/17/21 09/22/21 Yes Becky Augusta, NP  methylPREDNISolone (MEDROL  DOSEPAK) 4 MG TBPK tablet Take according to the package insert. 09/17/21  Yes Becky Augusta, NP  Red Yeast Rice Extract 600 MG CAPS  02/03/19  Yes [provider]  Vitamin D, Ergocalciferol, (DRISDOL) 50000 UNITS CAPS capsule Take 50,000 Units by mouth every 7 (seven) days.   Yes [provider]    Family History Family History  Problem Relation Age of Onset   Breast cancer Paternal Aunt 32    Social History Social History   Tobacco Use   Smoking status: Never  Vaping Use   Vaping Use: Never used  Substance Use Topics   Alcohol use: No     Allergies   Patient has no known allergies.   Review of Systems Review of Systems  Musculoskeletal:  Positive for neck pain and neck stiffness.  Neurological:  Negative for weakness and numbness.  Hematological: Negative.   Psychiatric/Behavioral: Negative.       Physical Exam Triage Vital Signs ED Triage Vitals  Enc Vitals Group     BP 09/17/21 1015 109/67     Pulse Rate 09/17/21 1015 80     Resp 09/17/21 1015 16     Temp 09/17/21 1015 98.3 F (36.8 C)     Temp Source 09/17/21 1015 Oral  SpO2 09/17/21 1015 97 %     Weight 09/17/21 1014 125 lb (56.7 kg)     Height 09/17/21 1014 5\' 7"  (1.702 m)     Head Circumference --      Peak Flow --      Pain Score 09/17/21 1012 8     Pain Loc --      Pain Edu? --      Excl. in GC? --    No data found.  Updated Vital Signs BP 109/67 (BP Location: Right Arm)   Pulse 80   Temp 98.3 F (36.8 C) (Oral)   Resp 16   Ht 5\' 7"  (1.702 m)   Wt 125 lb (56.7 kg)   SpO2 97%   BMI 19.58 kg/m   Visual Acuity Right Eye Distance:   Left Eye Distance:   Bilateral Distance:    Right Eye Near:   Left Eye Near:    Bilateral Near:     Physical Exam Vitals and nursing note reviewed.  Constitutional:      Appearance: Normal appearance. She is not ill-appearing.  Musculoskeletal:        General: Tenderness present. No swelling, deformity or signs of injury.  Skin:     General: Skin is warm and dry.     Capillary Refill: Capillary refill takes less than 2 seconds.     Findings: No bruising or erythema.  Neurological:     General: No focal deficit present.     Mental Status: She is alert and oriented to person, place, and time.  Psychiatric:        Mood and Affect: Mood normal.        Behavior: Behavior normal.        Thought Content: Thought content normal.        Judgment: Judgment normal.      UC Treatments / Results  Labs (all labs ordered are listed, but only abnormal results are displayed) Labs Reviewed - No data to display  EKG   Radiology No results found.  Procedures Procedures (including critical care time)  Medications Ordered in UC Medications - No data to display  Initial Impression / Assessment and Plan / UC Course  I have reviewed the triage vital signs and the nursing notes.  Pertinent labs & imaging results that were available during my care of the patient were reviewed by me and considered in my medical decision making (see chart for details).   Patient is a very pleasant, nontoxic-appearing 36 old female here for evaluation of left-sided neck pain that began this morning and has not responded to Tylenol, heat, or over-the-counter NSAIDs.  Patient is not having no neck issues and she denies any injury to provoke these current symptoms.  She states that the pain is coming in waves.  She states that she did do a lot of yard work yesterday and this may have precipitated her pain.  She has no numbness, tingling, weakness in any of her extremities.  On exam patient is holding her hand in normal anatomical alignment though she does turn with her shoulders to look from side to side as lateral rotation of her head is painful.  She has markedly decreased lateral rotation to the left and only slightly decreased rotation to the right.  Flexion and extension are normal.  There is no cervical spinous process tenderness or step-off noted.   She does have significant tenderness and spasm to the left trapezius muscle complex.  The right is  normal.  Both grips are 5/5 and upper extremity strength bilaterally is 5/5.  Patient exam is consistent with torticollis.  I will treat her with a Medrol Dosepak, baclofen, and give her 5 days worth of low-dose Valium she can use at bedtime as a muscle relaxer.  Also home physical therapy.  Patient has no open narcotics or benzodiazepine scripts and PDMP.   Final Clinical Impressions(s) / UC Diagnoses   Final diagnoses:  Torticollis     Discharge Instructions      Take the Medrol Dosepak according to the package instructions.  Due to the late hour of the day I would take your breakfast and lunch doses together to get yourself back on schedule.  Take the baclofen every 8 hours for muscle spasm.  I recommend you take it on a schedule for the next 2 days then you can back off to an as-needed basis.  You may continue to use your right sock, or other source of moist heat, for 20 minutes at a time 2-3 times a day to help improve blood flow and flush lactic acid from your system which is causing your neck muscles to be irritated.  I also recommend you increase your oral fluid intake so that you help to flush the lactic acid from your muscle group and ease the spasm.  Use the Valium at bedtime.  This will make you drowsy but will also help relax the muscles in your neck.  If your symptoms improve, they worsen, either return for reevaluation or see your primary care provider.     ED Prescriptions     Medication Sig Dispense Auth. Provider   methylPREDNISolone (MEDROL DOSEPAK) 4 MG TBPK tablet Take according to the package insert. 1 each Becky Augusta, NP   baclofen (LIORESAL) 10 MG tablet Take 1 tablet (10 mg total) by mouth 3 (three) times daily. 30 each Becky Augusta, NP   diazepam (VALIUM) 5 MG tablet Take 1 tablet (5 mg total) by mouth at bedtime for 5 days. 5 tablet Becky Augusta, NP       I have reviewed the PDMP during this encounter.   Becky Augusta, NP 09/17/21 1044

## 2021-09-17 NOTE — Discharge Instructions (Addendum)
Take the Medrol Dosepak according to the package instructions.  Due to the late hour of the day I would take your breakfast and lunch doses together to get yourself back on schedule.  Take the baclofen every 8 hours for muscle spasm.  I recommend you take it on a schedule for the next 2 days then you can back off to an as-needed basis.  You may continue to use your right sock, or other source of moist heat, for 20 minutes at a time 2-3 times a day to help improve blood flow and flush lactic acid from your system which is causing your neck muscles to be irritated.  I also recommend you increase your oral fluid intake so that you help to flush the lactic acid from your muscle group and ease the spasm.  Use the Valium at bedtime.  This will make you drowsy but will also help relax the muscles in your neck.  If your symptoms improve, they worsen, either return for reevaluation or see your primary care provider.

## 2022-06-05 ENCOUNTER — Other Ambulatory Visit: Payer: Self-pay | Admitting: Family Medicine

## 2022-06-05 DIAGNOSIS — Z1231 Encounter for screening mammogram for malignant neoplasm of breast: Secondary | ICD-10-CM

## 2022-06-26 ENCOUNTER — Ambulatory Visit
Admission: RE | Admit: 2022-06-26 | Discharge: 2022-06-26 | Disposition: A | Discharge status: Home / Self Care | Payer: BC Managed Care – PPO | Source: Ambulatory Visit | Attending: Family Medicine | Admitting: Family Medicine

## 2022-06-26 DIAGNOSIS — Z1231 Encounter for screening mammogram for malignant neoplasm of breast: Secondary | ICD-10-CM

## 2022-09-05 IMAGING — MG MM DIGITAL SCREENING BILAT W/ TOMO AND CAD
8 series · 9 of 24 positions shown · non-contrast
Comparison: None Available.

CLINICAL DATA: Screening.

EXAM:
DIGITAL SCREENING BILATERAL MAMMOGRAM WITH TOMOSYNTHESIS AND CAD
TECHNIQUE: Bilateral screening digital craniocaudal and mediolateral oblique
mammograms were obtained. Bilateral screening digital breast
tomosynthesis was performed. The images were evaluated with
computer-aided detection.

[L CC synth-2D]
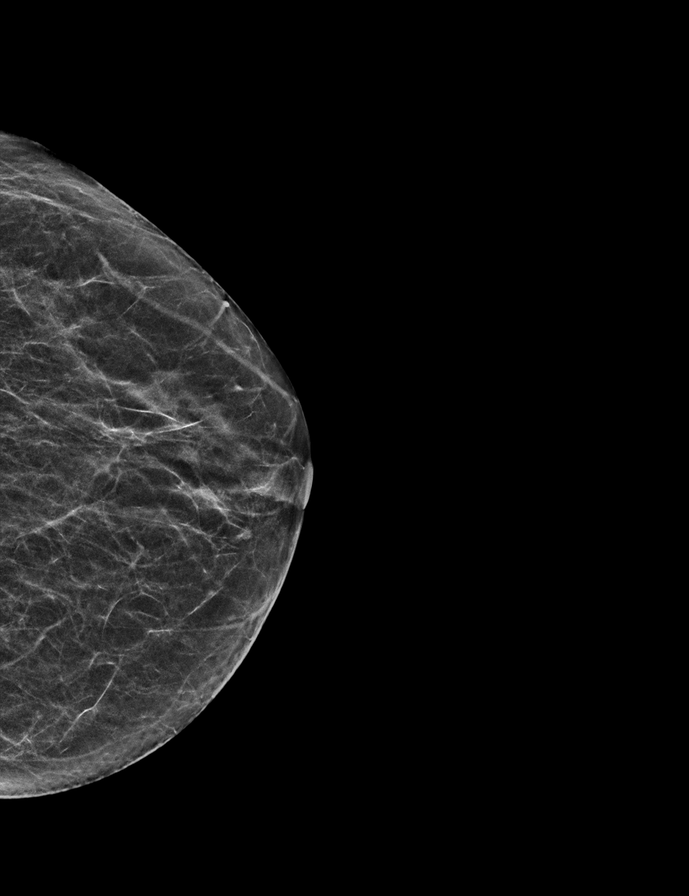

[R CC synth-2D]
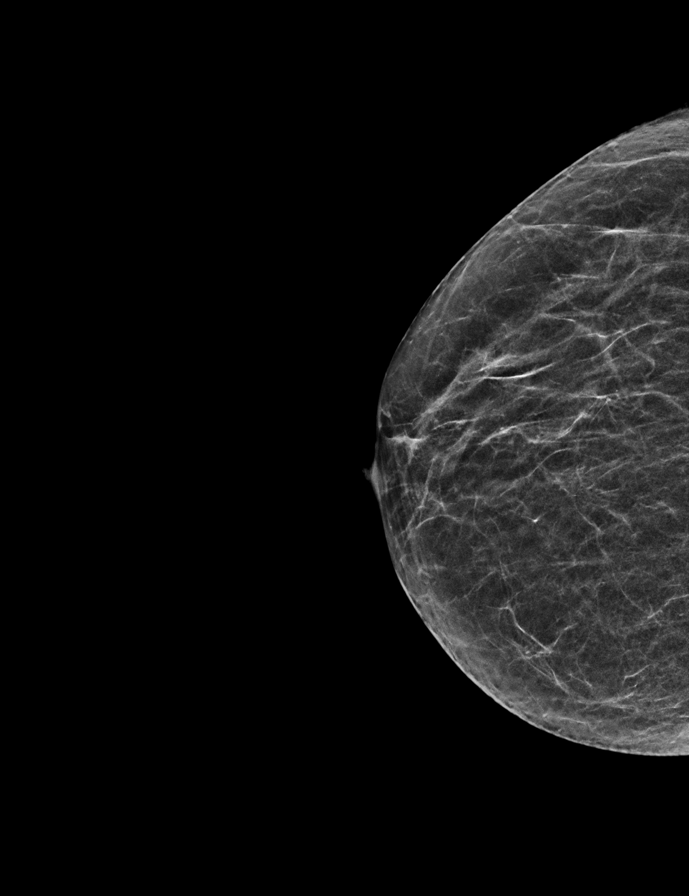

[R MLO synth-2D]
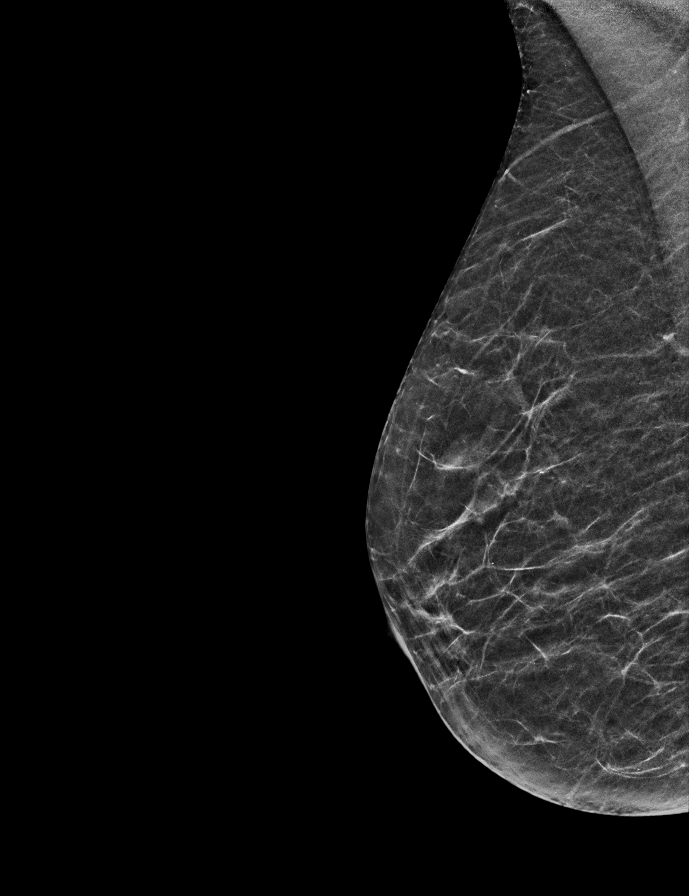

[L MLO synth-2D]
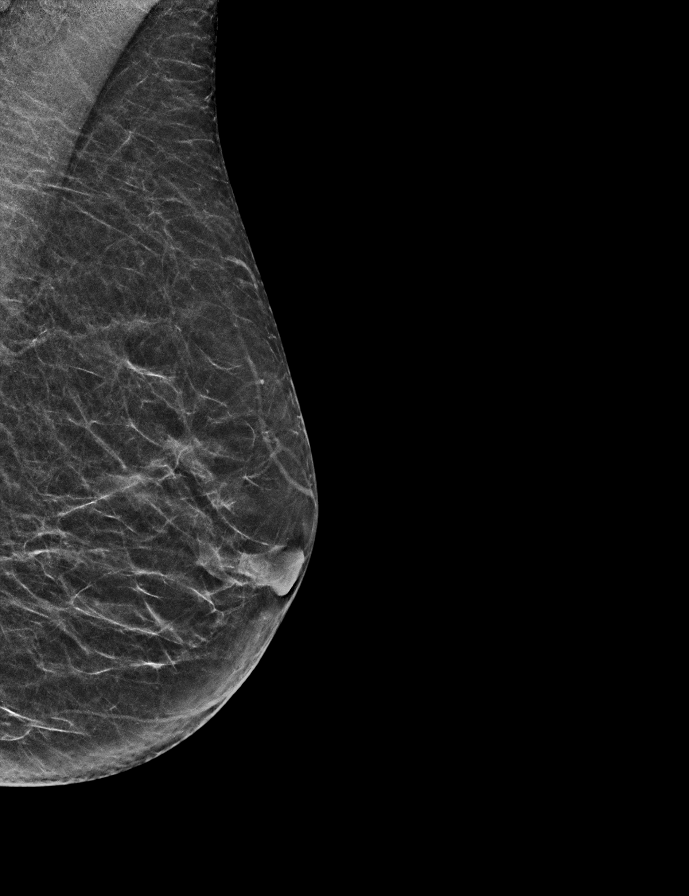

[L CC tomo · 2 of 42 frames shown]
[frame 14/42]
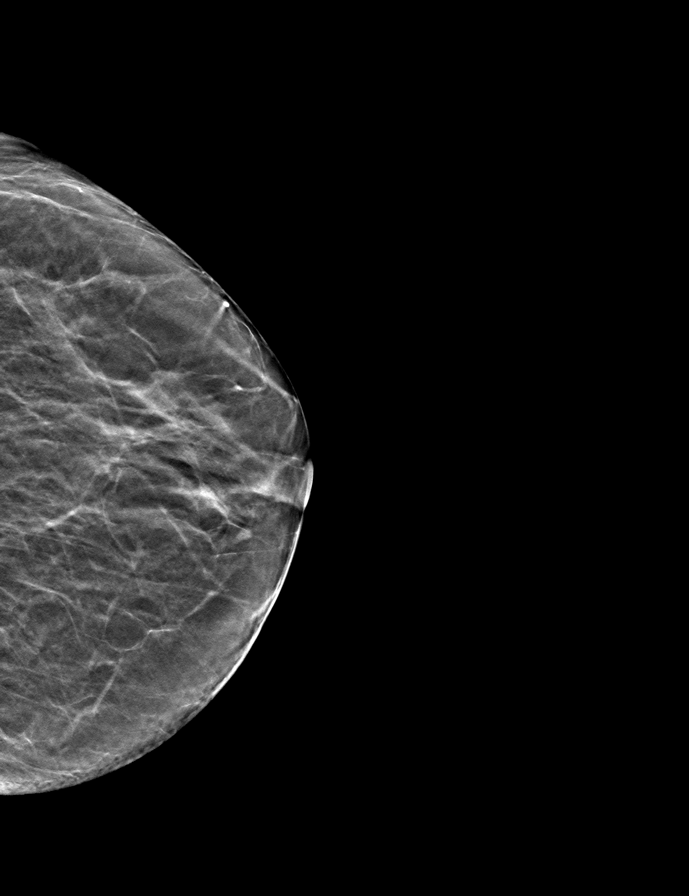
[frame 21/42]
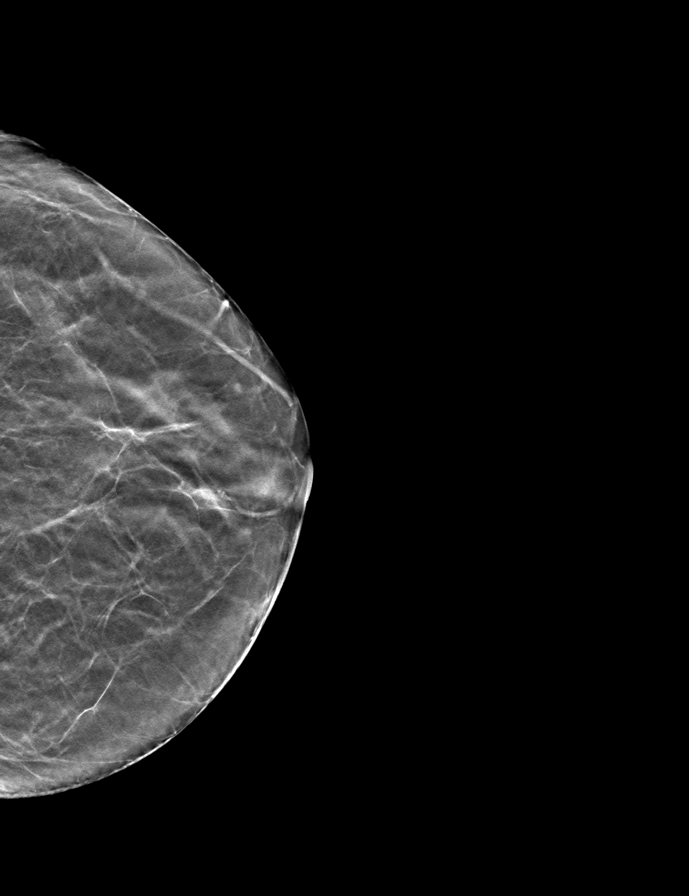

[R MLO tomo · tomo slice 21/42.0]
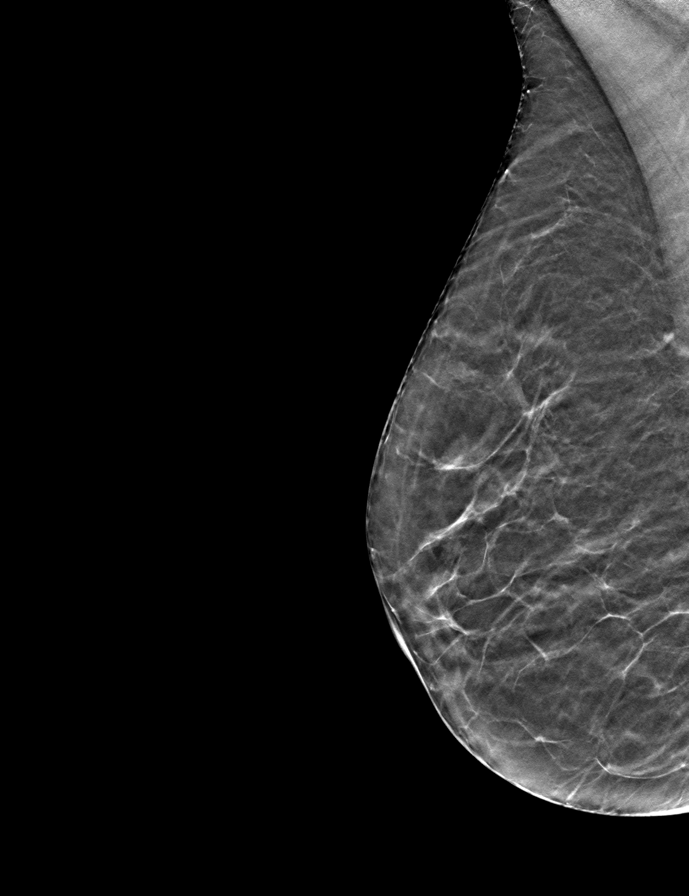

[R CC tomo · tomo slice 21/41.0]
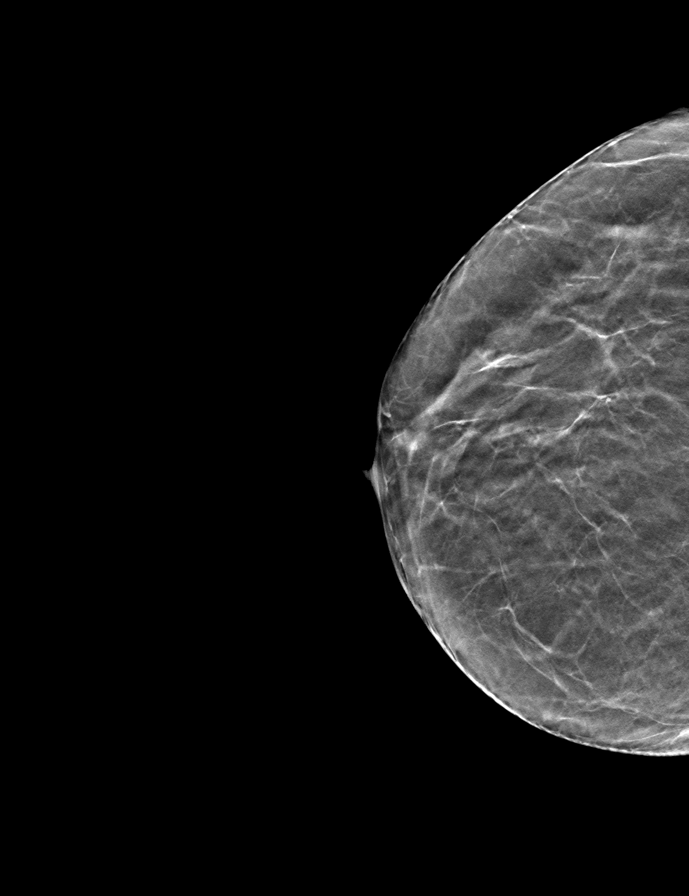

[L MLO tomo · tomo slice 22/43.0]
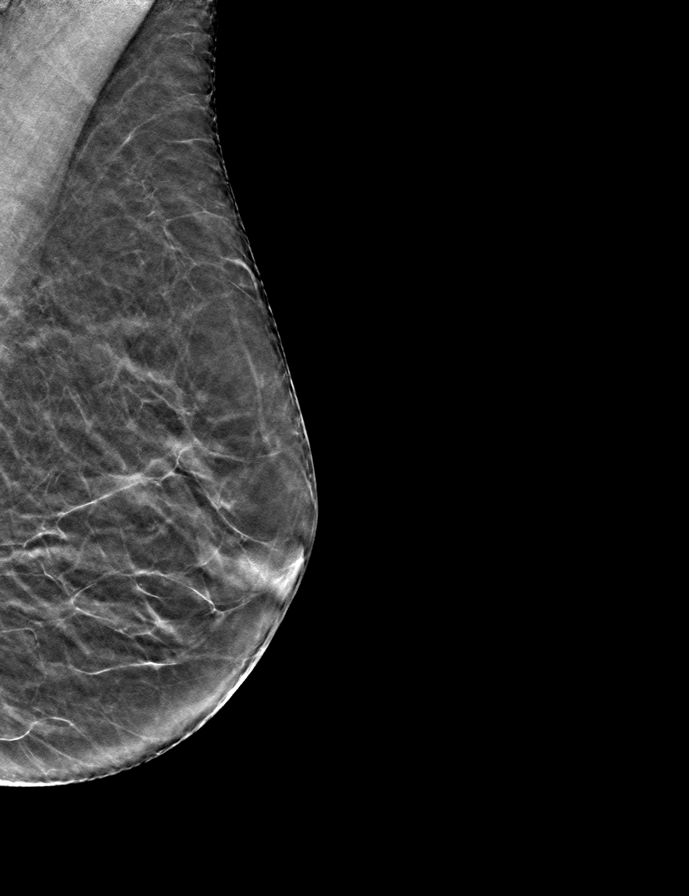

[9 of 24 positions shown; findings below may reference images not displayed]

ACR Breast Density Category c: The breast tissue is heterogeneously
dense, which may obscure small masses.
FINDINGS: There are no findings suspicious for malignancy.
IMPRESSION: No mammographic evidence of malignancy. A result letter of this
screening mammogram will be mailed directly to the patient.

RECOMMENDATION:
Screening mammogram in one year. (Code:BD-A-CHL)

BI-RADS CATEGORY  1: Negative.

## 2023-08-27 ENCOUNTER — Other Ambulatory Visit: Payer: Self-pay | Admitting: Family Medicine

## 2023-08-27 DIAGNOSIS — Z1231 Encounter for screening mammogram for malignant neoplasm of breast: Secondary | ICD-10-CM

## 2023-10-21 ENCOUNTER — Ambulatory Visit: Payer: Self-pay

## 2023-11-26 ENCOUNTER — Ambulatory Visit
Admission: RE | Admit: 2023-11-26 | Discharge: 2023-11-26 | Disposition: A | Discharge status: Home / Self Care | Payer: Self-pay | Source: Ambulatory Visit | Attending: Family Medicine | Admitting: Family Medicine

## 2023-11-26 DIAGNOSIS — Z1231 Encounter for screening mammogram for malignant neoplasm of breast: Secondary | ICD-10-CM | POA: Insufficient documentation
